# Patient Record
Sex: Female | Born: 1935 | Race: White | Hispanic: No
Health system: Southern US, Community
[De-identification: ages and names within clinical notes are randomized; demographics above are authoritative.]

## PROBLEM LIST (undated history)

## (undated) DIAGNOSIS — E0590 Thyrotoxicosis, unspecified without thyrotoxic crisis or storm: Secondary | ICD-10-CM

## (undated) DIAGNOSIS — I4891 Unspecified atrial fibrillation: Secondary | ICD-10-CM

## (undated) DIAGNOSIS — M1990 Unspecified osteoarthritis, unspecified site: Secondary | ICD-10-CM

## (undated) DIAGNOSIS — H3530 Unspecified macular degeneration: Secondary | ICD-10-CM

## (undated) DIAGNOSIS — K219 Gastro-esophageal reflux disease without esophagitis: Secondary | ICD-10-CM

---

## 2015-03-11 ENCOUNTER — Encounter: Payer: Self-pay | Admitting: Emergency Medicine

## 2015-03-11 ENCOUNTER — Emergency Department
Admission: EM | Admit: 2015-03-11 | Discharge: 2015-03-11 | Disposition: A | Discharge status: Home / Self Care | Payer: Medicare Other | Attending: Emergency Medicine | Admitting: Emergency Medicine

## 2015-03-11 ENCOUNTER — Emergency Department: Payer: Medicare Other

## 2015-03-11 DIAGNOSIS — T148XXA Other injury of unspecified body region, initial encounter: Secondary | ICD-10-CM

## 2015-03-11 DIAGNOSIS — S01112A Laceration without foreign body of left eyelid and periocular area, initial encounter: Secondary | ICD-10-CM | POA: Insufficient documentation

## 2015-03-11 DIAGNOSIS — Y998 Other external cause status: Secondary | ICD-10-CM | POA: Insufficient documentation

## 2015-03-11 DIAGNOSIS — Y9389 Activity, other specified: Secondary | ICD-10-CM | POA: Diagnosis not present

## 2015-03-11 DIAGNOSIS — S0990XA Unspecified injury of head, initial encounter: Secondary | ICD-10-CM | POA: Diagnosis present

## 2015-03-11 DIAGNOSIS — T07XXXA Unspecified multiple injuries, initial encounter: Secondary | ICD-10-CM

## 2015-03-11 DIAGNOSIS — W06XXXA Fall from bed, initial encounter: Secondary | ICD-10-CM | POA: Insufficient documentation

## 2015-03-11 DIAGNOSIS — S41112A Laceration without foreign body of left upper arm, initial encounter: Secondary | ICD-10-CM | POA: Insufficient documentation

## 2015-03-11 DIAGNOSIS — Y9289 Other specified places as the place of occurrence of the external cause: Secondary | ICD-10-CM | POA: Diagnosis not present

## 2015-03-11 DIAGNOSIS — S0181XA Laceration without foreign body of other part of head, initial encounter: Secondary | ICD-10-CM | POA: Insufficient documentation

## 2015-03-11 DIAGNOSIS — T148 Other injury of unspecified body region: Secondary | ICD-10-CM | POA: Diagnosis not present

## 2015-03-11 HISTORY — DX: Unspecified macular degeneration: H35.30

## 2015-03-11 HISTORY — DX: Unspecified osteoarthritis, unspecified site: M19.90

## 2015-03-11 HISTORY — DX: Gastro-esophageal reflux disease without esophagitis: K21.9

## 2015-03-11 HISTORY — DX: Thyrotoxicosis, unspecified without thyrotoxic crisis or storm: E05.90

## 2015-03-11 HISTORY — DX: Unspecified atrial fibrillation: I48.91

## 2015-03-11 NOTE — ED Provider Notes (Signed)
[facility] Emergency Department Provider Note  ____________________________________________  Time seen: 2:00 AM  I have reviewed the triage vital signs and the nursing notes.   HISTORY  Chief Complaint Fall     HPI Amber Lozano is a 81 y.o. female presents with history of "rolling out of bed. Patient states that she was readjusting in bed and axilla fell out of the bed striking her head and left side of her body. She denies any loss of consciousness. Multiple skin tears noted per EMS     Past Medical History  Diagnosis Date  . Arthritis   . Atrial fibrillation (HCC)   . Macular degeneration   . Thyrotoxicosis   . GERD (gastroesophageal reflux disease)     There are no active problems to display for this patient.   History reviewed. No pertinent past surgical history.  No current outpatient prescriptions on file.  Allergies Review of patient's allergies indicates no known allergies.  History reviewed. No pertinent family history.  Social History Social History  Substance Use Topics  . Smoking status: Unknown If Ever Smoked  . Smokeless tobacco: None  . Alcohol Use: No    Review of Systems  Constitutional: Negative for fever. Eyes: Negative for visual changes. ENT: Negative for sore throat. Cardiovascular: Negative for chest pain. Respiratory: Negative for shortness of breath. Gastrointestinal: Negative for abdominal pain, vomiting and diarrhea. Genitourinary: Negative for dysuria. Musculoskeletal: Negative for back pain. Skin: Negative for rash. Neurological: Negative for headaches, focal weakness or numbness.   10-point ROS otherwise negative.  ____________________________________________   PHYSICAL EXAM:  VITAL SIGNS: ED Triage Vitals  Enc Vitals Group     BP 03/11/15 0152 140/78 mmHg     Pulse Rate 03/11/15 0152 69     Resp 03/11/15 0152 18     Temp 03/11/15 0152 97.8 F (36.6 C)     Temp Source 03/11/15 0152  Oral     SpO2 03/11/15 0152 95 %     Weight 03/11/15 0152 133 lb (60.328 kg)     Height 03/11/15 0152  (1.651 m)     Head Cir --      Peak Flow --      Pain Score --      Pain Loc --      Pain Edu? --      Excl. in GC? --     Constitutional: Alert and oriented. Well appearing and in no distress. Eyes: Conjunctivae are normal. PERRL. Normal extraocular movements. ENT   Head: Normocephalic and atraumatic.   Nose: No congestion/rhinnorhea.   Mouth/Throat: Mucous membranes are moist.   Neck: No stridor. Hematological/Lymphatic/Immunilogical: No cervical lymphadenopathy. Cardiovascular: Normal rate, regular rhythm. Normal and symmetric distal pulses are present in all extremities. No murmurs, rubs, or gallops. Respiratory: Normal respiratory effort without tachypnea nor retractions. Breath sounds are clear and equal bilaterally. No wheezes/rales/rhonchi. Gastrointestinal: Soft and nontender. No distention. There is no CVA tenderness. Genitourinary: deferred Musculoskeletal: Nontender with normal range of motion in all extremities. No joint effusions.  No lower extremity tenderness nor edema. Neurologic:  Normal speech and language. No gross focal neurologic deficits are appreciated. Speech is normal.  Skin:  Skin tear noted to the left arm left temporal region of the head as well as left periorbital area Psychiatric: Mood and affect are normal. Speech and behavior are normal. Patient exhibits appropriate insight and judgment.    RADIOLOGY   CT Head Wo Contrast (Final result) Result time: 03/11/15 02:19:45  Final result by Rad Results In Interface (03/11/15 02:19:45)   Narrative:   CLINICAL DATA: One 18hundred year old female with fall and head injury  EXAM: CT HEAD WITHOUT CONTRAST  TECHNIQUE: Contiguous axial images were obtained from the base of the skull through the vertex without intravenous contrast.  COMPARISON: None.  FINDINGS: The ventricles  are dilated and the sulci are prominent compatible with age-related atrophy. Periventricular and deep white matter hypodensities represent chronic microvascular ischemic changes. There is no intracranial hemorrhage. No mass effect or midline shift identified.  The visualized paranasal sinuses and mastoid air cells are well aerated. The calvarium is intact.  IMPRESSION: No acute intracranial hemorrhage.  Age-related atrophy and chronic microvascular ischemic disease.   Electronically Signed By: Elgie CollardArash Radparvar M.D. On: 03/11/2015 02:19         INITIAL IMPRESSION / ASSESSMENT AND PLAN / ED COURSE  Pertinent labs & imaging results that were available during my care of the patient were reviewed by me and considered in my medical decision making (see chart for details).   Patient's skin tears were re-approximated with Steri-Strips I spoke with the patient's daughter who is her power of attorney who stated that she did not want any other interventions performed.  ____________________________________________   FINAL CLINICAL IMPRESSION(S) / ED DIAGNOSES  Final diagnoses:  Multiple skin tears  Abrasions of multiple sites      Darci Currentandolph N Shandell Jallow, MD 03/12/15 [phone]

## 2015-03-11 NOTE — ED Notes (Signed)
Ems transport back to [facility]

## 2015-03-11 NOTE — ED Notes (Signed)
Spoke with Nedra HaiLee pts daughter in law who is the POA she states that they do no want any tests to be ran on the patient, explained that the PT presented to the ER and the DR decided it medically necessary to perform a head CT due to pts head injury. POA advised that no further testing is permitted. EDP and Charge RN made aware

## 2015-03-11 NOTE — ED Notes (Signed)
ems pt from [facility], pt reports uncomfortable in bed and tried to reposition and rolled out of the bed. pt denies injuries but has skin tears to left eye, left arm, and left leg.

## 2015-07-13 DEATH — deceased

## 2016-10-19 IMAGING — CT CT HEAD W/O CM
2 series · 13 of 30 positions shown, 15 images · non-contrast
Comparison: None.

CLINICAL DATA: [REDACTED] female with fall and head
injury

EXAM:
CT HEAD WITHOUT CONTRAST
TECHNIQUE: Contiguous axial images were obtained from the base of the skull
through the vertex without intravenous contrast.

[Series 2: head wo · axial · 0.43mm/px · z∈[-150,-51]mm · 5 of 34 slices shown, 7 images]
[im 6/34  brain]
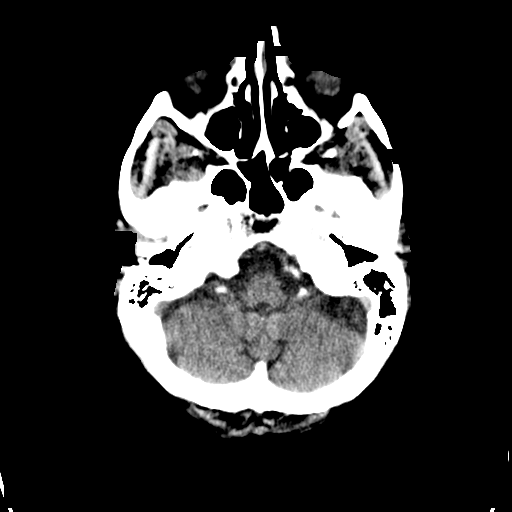
[im 6/34  bone]
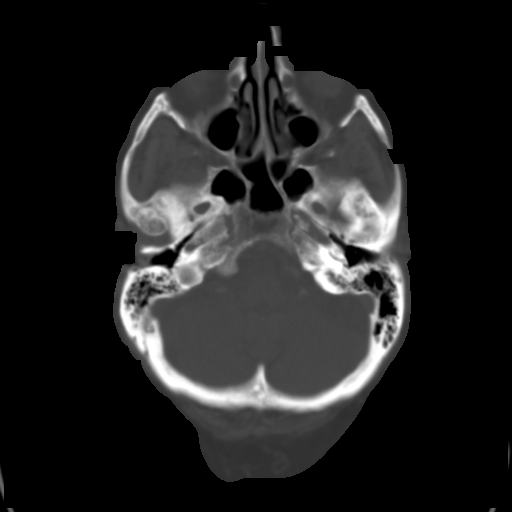
[im 12/34  brain]
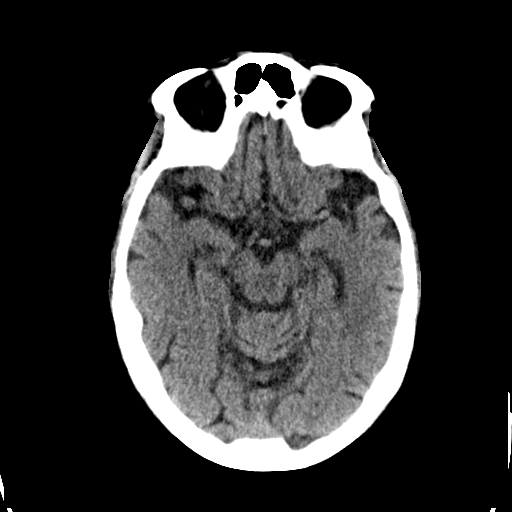
[im 17/34  brain]
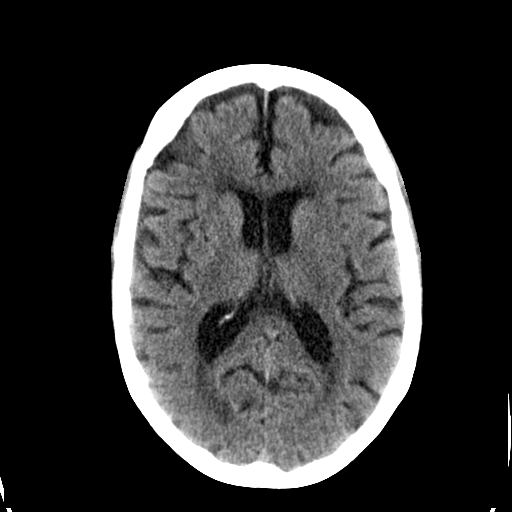
[im 23/34  brain]
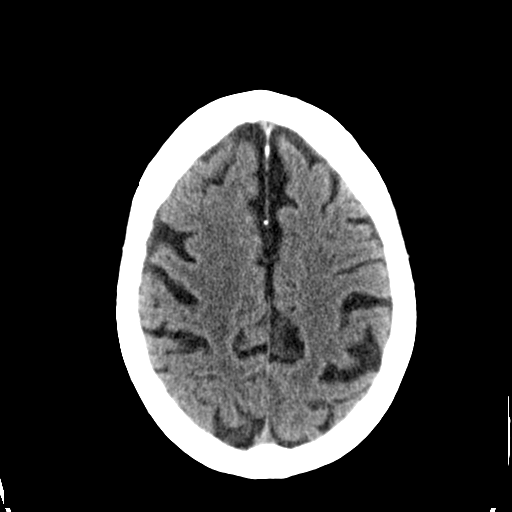
[im 28/34  brain]
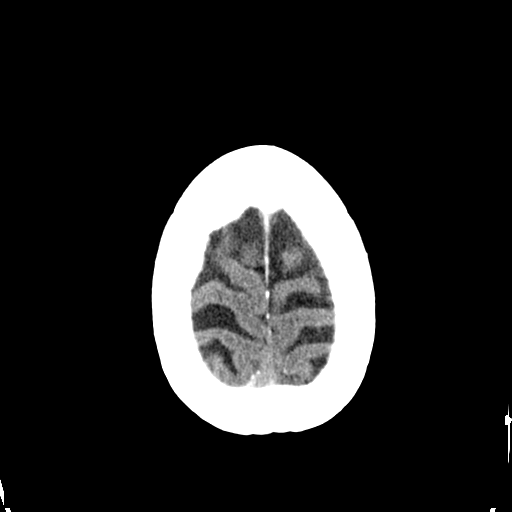
[im 28/34  bone]
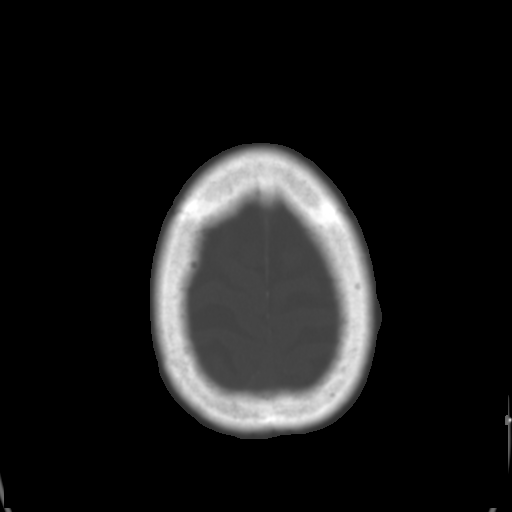

[Series 3: head bone · axial · 0.43mm/px · z∈[-161,-29]mm · 8 of 108 slices shown]
[im 10/108  bone]
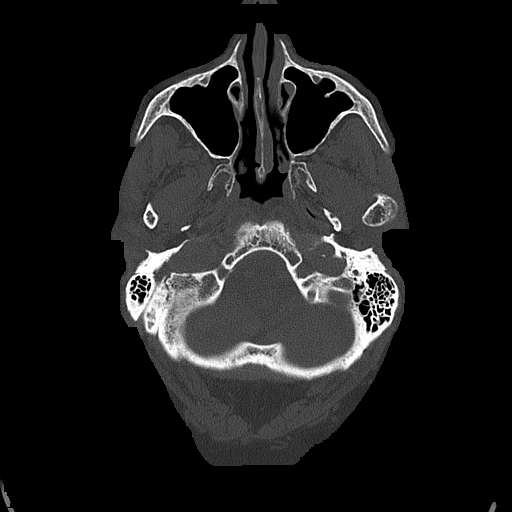
[im 20/108  bone]
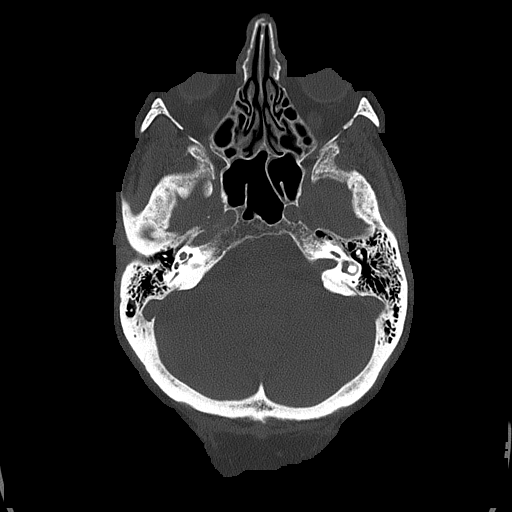
[im 35/108  bone]
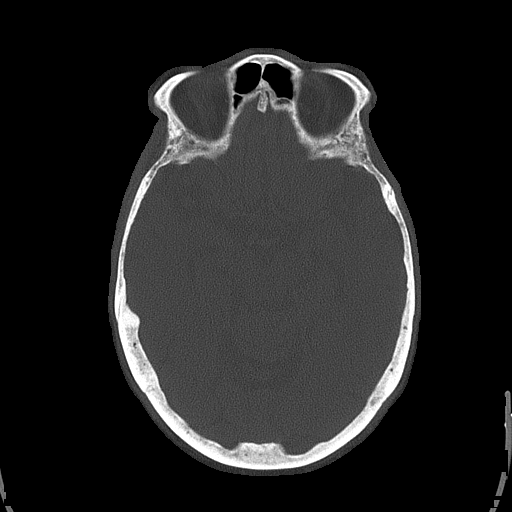
[im 49/108  bone]
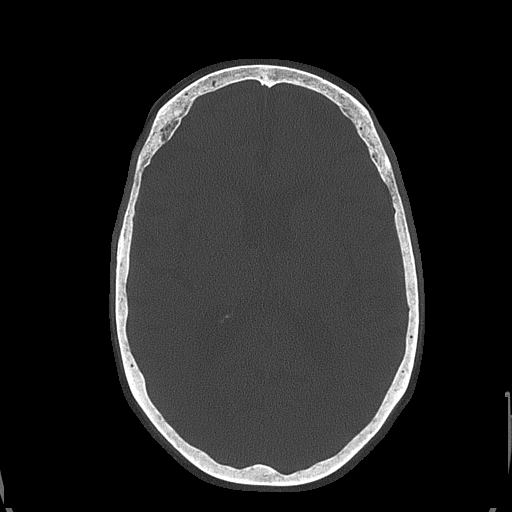
[im 59/108  bone]
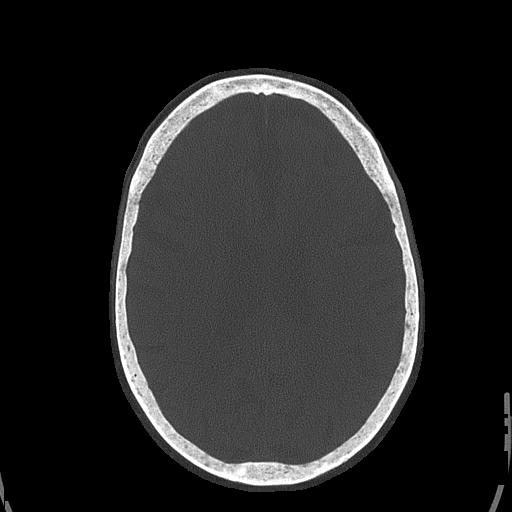
[im 73/108  bone]
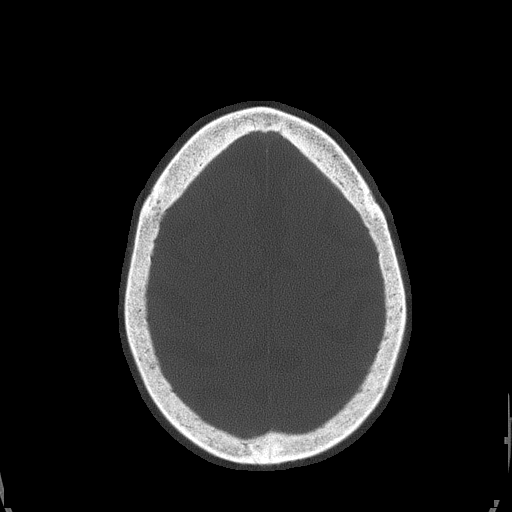
[im 88/108  bone]
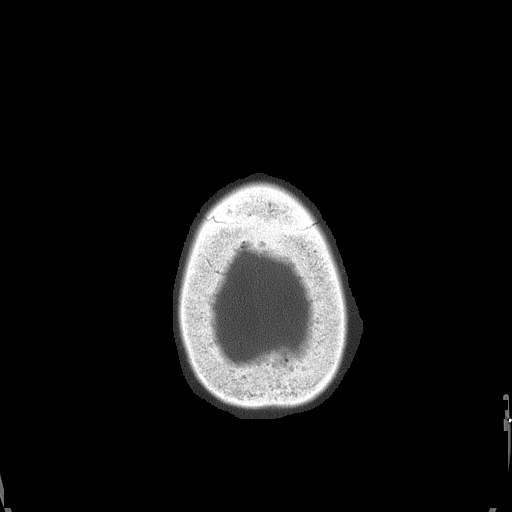
[im 98/108  bone]
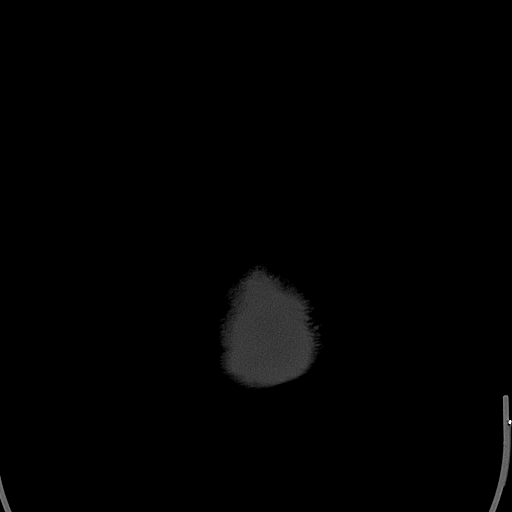

[13 of 30 positions shown; findings below may reference images not displayed]

FINDINGS: The ventricles are dilated and the sulci are prominent compatible
with age-related atrophy. Periventricular and deep white matter
hypodensities represent chronic microvascular ischemic changes.
There is no intracranial hemorrhage. No mass effect or midline shift
identified.

The visualized paranasal sinuses and mastoid air cells are well
aerated. The calvarium is intact.
IMPRESSION: No acute intracranial hemorrhage.

Age-related atrophy and chronic microvascular ischemic disease.
